# Patient Record
Sex: Female | Born: 1970 | Race: White | Hispanic: No | Marital: Single | Smoking: Former smoker
Health system: Southern US, Community
[De-identification: ages and names within clinical notes are randomized; demographics above are authoritative.]

## PROBLEM LIST (undated history)

## (undated) DIAGNOSIS — K219 Gastro-esophageal reflux disease without esophagitis: Secondary | ICD-10-CM

## (undated) DIAGNOSIS — I10 Essential (primary) hypertension: Secondary | ICD-10-CM

## (undated) DIAGNOSIS — D649 Anemia, unspecified: Secondary | ICD-10-CM

## (undated) DIAGNOSIS — F419 Anxiety disorder, unspecified: Secondary | ICD-10-CM

## (undated) DIAGNOSIS — R002 Palpitations: Secondary | ICD-10-CM

---

## 2019-01-19 HISTORY — PX: COLONOSCOPY: SHX174

## 2021-02-06 ENCOUNTER — Ambulatory Visit: Admit: 2021-02-06 | Payer: Self-pay | Admitting: Optometry

## 2021-02-06 SURGERY — EXCISION MASS
Anesthesia: General | Laterality: Right

## 2021-02-10 ENCOUNTER — Other Ambulatory Visit: Payer: Self-pay | Admitting: Optometry

## 2021-02-10 DIAGNOSIS — R229 Localized swelling, mass and lump, unspecified: Secondary | ICD-10-CM

## 2021-03-02 ENCOUNTER — Ambulatory Visit
Admission: RE | Admit: 2021-03-02 | Discharge: 2021-03-02 | Disposition: A | Discharge status: Home / Self Care | Payer: No Typology Code available for payment source | Source: Ambulatory Visit | Attending: Optometry | Admitting: Optometry

## 2021-03-02 DIAGNOSIS — R229 Localized swelling, mass and lump, unspecified: Secondary | ICD-10-CM

## 2021-03-02 MED ORDER — IOPAMIDOL (ISOVUE-300) INJECTION 61%
75.0000 mL | Freq: Once | INTRAVENOUS | Status: AC | PRN
  Administered 2021-03-02: 75 mL via INTRAVENOUS

## 2021-05-13 ENCOUNTER — Other Ambulatory Visit: Payer: Self-pay

## 2021-05-13 ENCOUNTER — Encounter (HOSPITAL_COMMUNITY): Payer: Self-pay | Admitting: Optometry

## 2021-05-13 NOTE — Progress Notes (Signed)
SDW CALL ? ?Patient was given pre-op instructions over the phone. The opportunity was given for the patient to ask questions. No further questions asked. Patient verbalized understanding of instructions given. ? ? ?PCP - Dr. Di Kindle ?Cardiologist - denies ? ?Chest x-ray - n/a ?EKG - needs DOS ?Stress Test - denies ?ECHO - denies ?Cardiac Cath - denies ? ? ?Blood Thinner Instructions: ?Aspirin Instructions: No NSAIDS ? ?ERAS Protcol - NPO per MD H&P ? ?COVID TEST- n/a ambulatory surgery ? ? ?Anesthesia review: no ? ?Patient denies shortness of breath, fever, cough and chest pain over the phone call ? ?

## 2021-05-15 ENCOUNTER — Other Ambulatory Visit: Payer: Self-pay

## 2021-05-15 ENCOUNTER — Encounter (HOSPITAL_COMMUNITY)
Admission: RE | Disposition: A | Discharge status: Home / Self Care | Payer: Self-pay | Source: Home / Self Care | Attending: Optometry

## 2021-05-15 ENCOUNTER — Ambulatory Visit (HOSPITAL_BASED_OUTPATIENT_CLINIC_OR_DEPARTMENT_OTHER): Payer: No Typology Code available for payment source | Admitting: Anesthesiology

## 2021-05-15 ENCOUNTER — Ambulatory Visit (HOSPITAL_COMMUNITY): Payer: No Typology Code available for payment source | Admitting: Anesthesiology

## 2021-05-15 ENCOUNTER — Encounter (HOSPITAL_COMMUNITY): Payer: Self-pay | Admitting: Optometry

## 2021-05-15 ENCOUNTER — Ambulatory Visit (HOSPITAL_COMMUNITY)
Admission: RE | Admit: 2021-05-15 | Discharge: 2021-05-15 | Disposition: A | Discharge status: Home / Self Care | Payer: No Typology Code available for payment source | Attending: Optometry | Admitting: Optometry

## 2021-05-15 DIAGNOSIS — H04551 Acquired stenosis of right nasolacrimal duct: Secondary | ICD-10-CM

## 2021-05-15 DIAGNOSIS — H04571 Stenosis of right lacrimal sac: Secondary | ICD-10-CM | POA: Diagnosis present

## 2021-05-15 DIAGNOSIS — H0489 Other disorders of lacrimal system: Secondary | ICD-10-CM | POA: Diagnosis not present

## 2021-05-15 DIAGNOSIS — F419 Anxiety disorder, unspecified: Secondary | ICD-10-CM | POA: Diagnosis not present

## 2021-05-15 DIAGNOSIS — K219 Gastro-esophageal reflux disease without esophagitis: Secondary | ICD-10-CM | POA: Diagnosis not present

## 2021-05-15 DIAGNOSIS — Z79899 Other long term (current) drug therapy: Secondary | ICD-10-CM | POA: Diagnosis not present

## 2021-05-15 DIAGNOSIS — H04201 Unspecified epiphora, right lacrimal gland: Secondary | ICD-10-CM

## 2021-05-15 DIAGNOSIS — R229 Localized swelling, mass and lump, unspecified: Secondary | ICD-10-CM

## 2021-05-15 DIAGNOSIS — I10 Essential (primary) hypertension: Secondary | ICD-10-CM | POA: Diagnosis not present

## 2021-05-15 DIAGNOSIS — Z87891 Personal history of nicotine dependence: Secondary | ICD-10-CM | POA: Insufficient documentation

## 2021-05-15 HISTORY — DX: Anxiety disorder, unspecified: F41.9

## 2021-05-15 HISTORY — DX: Anemia, unspecified: D64.9

## 2021-05-15 HISTORY — DX: Essential (primary) hypertension: I10

## 2021-05-15 HISTORY — DX: Gastro-esophageal reflux disease without esophagitis: K21.9

## 2021-05-15 HISTORY — DX: Palpitations: R00.2

## 2021-05-15 HISTORY — PX: DACRORHINOCYSTOTOMY: SHX5559

## 2021-05-15 HISTORY — PX: LACRIMAL DUCT EXPLORATION: SHX6569

## 2021-05-15 SURGERY — EXPLORATION, LACRIMAL DUCT
Anesthesia: General | Site: Eye | Laterality: Right

## 2021-05-15 MED ORDER — BACITRACIN ZINC 500 UNIT/GM EX OINT
TOPICAL_OINTMENT | CUTANEOUS | Status: AC
  Filled 2021-05-15: qty 28.35

## 2021-05-15 MED ORDER — OXYMETAZOLINE HCL 0.05 % NA SOLN
NASAL | Status: AC
  Filled 2021-05-15: qty 30

## 2021-05-15 MED ORDER — OXYCODONE HCL 5 MG PO TABS: 5.0000 mg | ORAL_TABLET | Freq: Once | ORAL | Status: DC | PRN

## 2021-05-15 MED ORDER — ACETAMINOPHEN 650 MG RE SUPP
650.0000 mg | RECTAL | Status: DC | PRN
  Filled 2021-05-15: qty 1

## 2021-05-15 MED ORDER — FENTANYL CITRATE (PF) 100 MCG/2ML IJ SOLN
25.0000 ug | INTRAMUSCULAR | Status: DC | PRN
  Administered 2021-05-15: 50 ug via INTRAVENOUS

## 2021-05-15 MED ORDER — ONDANSETRON HCL 4 MG/2ML IJ SOLN
INTRAMUSCULAR | Status: DC | PRN
  Administered 2021-05-15: 4 mg via INTRAVENOUS

## 2021-05-15 MED ORDER — ORAL CARE MOUTH RINSE: 15.0000 mL | Freq: Once | OROMUCOSAL | Status: AC

## 2021-05-15 MED ORDER — FENTANYL CITRATE (PF) 100 MCG/2ML IJ SOLN
INTRAMUSCULAR | Status: AC
  Filled 2021-05-15: qty 2

## 2021-05-15 MED ORDER — EPHEDRINE 5 MG/ML INJ
INTRAVENOUS | Status: AC
  Filled 2021-05-15: qty 5

## 2021-05-15 MED ORDER — HEMOSTATIC AGENTS (NO CHARGE) OPTIME
TOPICAL | Status: DC | PRN
  Administered 2021-05-15: 1 via TOPICAL

## 2021-05-15 MED ORDER — SODIUM CHLORIDE 0.9% FLUSH: 3.0000 mL | Freq: Two times a day (BID) | INTRAVENOUS | Status: DC

## 2021-05-15 MED ORDER — ERYTHROMYCIN 5 MG/GM OP OINT
1.0000 "application " | TOPICAL_OINTMENT | Freq: Once | OPHTHALMIC | Status: AC
  Administered 2021-05-15: 1 via OPHTHALMIC
  Filled 2021-05-15: qty 3.5

## 2021-05-15 MED ORDER — EPHEDRINE SULFATE-NACL 50-0.9 MG/10ML-% IV SOSY
PREFILLED_SYRINGE | INTRAVENOUS | Status: DC | PRN
  Administered 2021-05-15 (×4): 5 mg via INTRAVENOUS

## 2021-05-15 MED ORDER — FENTANYL CITRATE (PF) 250 MCG/5ML IJ SOLN
INTRAMUSCULAR | Status: AC
  Filled 2021-05-15: qty 5

## 2021-05-15 MED ORDER — OXYCODONE HCL 5 MG PO TABS: 5.0000 mg | ORAL_TABLET | ORAL | Status: DC | PRN

## 2021-05-15 MED ORDER — PHENYLEPHRINE 80 MCG/ML (10ML) SYRINGE FOR IV PUSH (FOR BLOOD PRESSURE SUPPORT)
PREFILLED_SYRINGE | INTRAVENOUS | Status: DC | PRN
  Administered 2021-05-15 (×2): 160 ug via INTRAVENOUS

## 2021-05-15 MED ORDER — SODIUM CHLORIDE 0.9 % IV SOLN: INTRAVENOUS | Status: DC

## 2021-05-15 MED ORDER — STERILE WATER FOR IRRIGATION IR SOLN
Status: DC | PRN
  Administered 2021-05-15: 1000 mL

## 2021-05-15 MED ORDER — OXYMETAZOLINE HCL 0.05 % NA SOLN
NASAL | Status: DC | PRN
  Administered 2021-05-15: 1

## 2021-05-15 MED ORDER — ONDANSETRON HCL 4 MG/2ML IJ SOLN: 4.0000 mg | Freq: Once | INTRAMUSCULAR | Status: DC | PRN

## 2021-05-15 MED ORDER — 0.9 % SODIUM CHLORIDE (POUR BTL) OPTIME
TOPICAL | Status: DC | PRN
  Administered 2021-05-15: 1000 mL

## 2021-05-15 MED ORDER — LIDOCAINE 2% (20 MG/ML) 5 ML SYRINGE
INTRAMUSCULAR | Status: AC
  Filled 2021-05-15: qty 5

## 2021-05-15 MED ORDER — ACETAMINOPHEN 500 MG PO TABS: 1000.0000 mg | ORAL_TABLET | Freq: Four times a day (QID) | ORAL | Status: DC

## 2021-05-15 MED ORDER — MIDAZOLAM HCL 5 MG/5ML IJ SOLN
INTRAMUSCULAR | Status: DC | PRN
Start: 2021-05-15 — End: 2021-05-15
  Administered 2021-05-15: 2 mg via INTRAVENOUS

## 2021-05-15 MED ORDER — ACETAMINOPHEN 325 MG PO TABS
650.0000 mg | ORAL_TABLET | ORAL | Status: DC | PRN
  Filled 2021-05-15: qty 2

## 2021-05-15 MED ORDER — BUPIVACAINE HCL (PF) 0.75 % IJ SOLN
INTRAMUSCULAR | Status: AC
  Filled 2021-05-15: qty 10

## 2021-05-15 MED ORDER — DEXAMETHASONE SODIUM PHOSPHATE 10 MG/ML IJ SOLN
INTRAMUSCULAR | Status: AC
  Filled 2021-05-15: qty 1

## 2021-05-15 MED ORDER — ACETAMINOPHEN 500 MG PO TABS
ORAL_TABLET | ORAL | Status: AC
  Administered 2021-05-15: 1000 mg via ORAL
  Filled 2021-05-15: qty 2

## 2021-05-15 MED ORDER — HYDROCODONE-ACETAMINOPHEN 5-325 MG PO TABS: 1.0000 | ORAL_TABLET | Freq: Four times a day (QID) | ORAL | 0 refills | Status: AC | PRN

## 2021-05-15 MED ORDER — LIDOCAINE-EPINEPHRINE 1 %-1:100000 IJ SOLN
INTRAMUSCULAR | Status: DC | PRN
  Administered 2021-05-15: 2.5 mL

## 2021-05-15 MED ORDER — LIDOCAINE-EPINEPHRINE 1 %-1:100000 IJ SOLN
INTRAMUSCULAR | Status: AC
  Filled 2021-05-15: qty 1

## 2021-05-15 MED ORDER — CHLORHEXIDINE GLUCONATE 0.12 % MT SOLN: 15.0000 mL | Freq: Once | OROMUCOSAL | Status: AC

## 2021-05-15 MED ORDER — FENTANYL CITRATE (PF) 100 MCG/2ML IJ SOLN: 25.0000 ug | INTRAMUSCULAR | Status: DC | PRN

## 2021-05-15 MED ORDER — KETOROLAC TROMETHAMINE 15 MG/ML IJ SOLN: 15.0000 mg | Freq: Four times a day (QID) | INTRAMUSCULAR | Status: DC

## 2021-05-15 MED ORDER — PHENYLEPHRINE 80 MCG/ML (10ML) SYRINGE FOR IV PUSH (FOR BLOOD PRESSURE SUPPORT)
PREFILLED_SYRINGE | INTRAVENOUS | Status: AC
  Filled 2021-05-15: qty 10

## 2021-05-15 MED ORDER — LIDOCAINE 2% (20 MG/ML) 5 ML SYRINGE
INTRAMUSCULAR | Status: DC | PRN
  Administered 2021-05-15: 60 mg via INTRAVENOUS

## 2021-05-15 MED ORDER — ROCURONIUM BROMIDE 10 MG/ML (PF) SYRINGE
PREFILLED_SYRINGE | INTRAVENOUS | Status: AC
  Filled 2021-05-15: qty 10

## 2021-05-15 MED ORDER — DEXAMETHASONE SODIUM PHOSPHATE 10 MG/ML IJ SOLN
INTRAMUSCULAR | Status: DC | PRN
  Administered 2021-05-15: 10 mg via INTRAVENOUS

## 2021-05-15 MED ORDER — LACTATED RINGERS IV SOLN: INTRAVENOUS | Status: DC | PRN

## 2021-05-15 MED ORDER — FENTANYL CITRATE (PF) 250 MCG/5ML IJ SOLN
INTRAMUSCULAR | Status: DC | PRN
  Administered 2021-05-15 (×5): 50 ug via INTRAVENOUS

## 2021-05-15 MED ORDER — OXYMETAZOLINE HCL 0.05 % NA SOLN
1.0000 | NASAL | Status: AC
  Administered 2021-05-15 (×2): 1 via NASAL

## 2021-05-15 MED ORDER — MIDAZOLAM HCL 2 MG/2ML IJ SOLN
INTRAMUSCULAR | Status: AC
  Filled 2021-05-15: qty 2

## 2021-05-15 MED ORDER — BSS IO SOLN
INTRAOCULAR | Status: DC | PRN
  Administered 2021-05-15: 15 mL

## 2021-05-15 MED ORDER — ONDANSETRON HCL 4 MG/2ML IJ SOLN
INTRAMUSCULAR | Status: AC
  Filled 2021-05-15: qty 2

## 2021-05-15 MED ORDER — ACETAMINOPHEN 500 MG PO TABS
1000.0000 mg | ORAL_TABLET | Freq: Once | ORAL | Status: AC
Start: 2021-05-15 — End: 2021-05-15

## 2021-05-15 MED ORDER — ERYTHROMYCIN 5 MG/GM OP OINT
TOPICAL_OINTMENT | OPHTHALMIC | Status: AC
  Filled 2021-05-15: qty 3.5

## 2021-05-15 MED ORDER — OXYCODONE HCL 5 MG/5ML PO SOLN: 5.0000 mg | Freq: Once | ORAL | Status: DC | PRN

## 2021-05-15 MED ORDER — PROPOFOL 10 MG/ML IV BOLUS
INTRAVENOUS | Status: AC
  Filled 2021-05-15: qty 20

## 2021-05-15 MED ORDER — NEOMYCIN-POLYMYXIN-DEXAMETH 3.5-10000-0.1 OP SUSP
1.0000 [drp] | Freq: Once | OPHTHALMIC | Status: AC
Start: 2021-05-15 — End: 2021-05-15
  Administered 2021-05-15: 1 [drp] via OPHTHALMIC
  Filled 2021-05-15 (×2): qty 5

## 2021-05-15 MED ORDER — SODIUM CHLORIDE 0.9% FLUSH: 3.0000 mL | INTRAVENOUS | Status: DC | PRN

## 2021-05-15 MED ORDER — AMISULPRIDE (ANTIEMETIC) 5 MG/2ML IV SOLN: 10.0000 mg | Freq: Once | INTRAVENOUS | Status: DC | PRN

## 2021-05-15 MED ORDER — SODIUM CHLORIDE 0.9 % IV SOLN: 250.0000 mL | INTRAVENOUS | Status: DC | PRN

## 2021-05-15 MED ORDER — PROPOFOL 10 MG/ML IV BOLUS
INTRAVENOUS | Status: DC | PRN
  Administered 2021-05-15: 50 mg via INTRAVENOUS
  Administered 2021-05-15: 200 mg via INTRAVENOUS
  Administered 2021-05-15 (×2): 50 mg via INTRAVENOUS

## 2021-05-15 MED ORDER — OXYMETAZOLINE HCL 0.05 % NA SOLN
NASAL | Status: AC
  Administered 2021-05-15: 1 via NASAL
  Filled 2021-05-15: qty 30

## 2021-05-15 MED ORDER — CHLORHEXIDINE GLUCONATE 0.12 % MT SOLN
OROMUCOSAL | Status: AC
  Administered 2021-05-15: 15 mL via OROMUCOSAL
  Filled 2021-05-15: qty 15

## 2021-05-15 MED ORDER — TRIAMCINOLONE ACETONIDE 40 MG/ML IJ SUSP
INTRAMUSCULAR | Status: DC | PRN
  Administered 2021-05-15: 40 mg

## 2021-05-15 MED ORDER — TRIAMCINOLONE ACETONIDE 40 MG/ML IJ SUSP
INTRAMUSCULAR | Status: AC
  Filled 2021-05-15: qty 5

## 2021-05-15 MED ORDER — BSS IO SOLN
INTRAOCULAR | Status: AC
  Filled 2021-05-15: qty 15

## 2021-05-15 SURGICAL SUPPLY — 38 items
APPLICATOR COTTON TIP 6 STRL (MISCELLANEOUS) ×1 IMPLANT
APPLICATOR COTTON TIP 6IN STRL (MISCELLANEOUS) ×2 IMPLANT
BAG COUNTER SPONGE SURGICOUNT (BAG) ×2 IMPLANT
BLADE SURG 15 STRL LF DISP TIS (BLADE) ×1 IMPLANT
BLADE SURG 15 STRL SS (BLADE) ×1
CORD BIPOLAR FORCEPS 12FT (ELECTRODE) ×2 IMPLANT
COVER SURGICAL LIGHT HANDLE (MISCELLANEOUS) ×2 IMPLANT
DRAPE ORTHO SPLIT 77X108 STRL (DRAPES) ×1
DRAPE SURG ORHT 6 SPLT 77X108 (DRAPES) ×1 IMPLANT
FORCEPS BIPOLAR SPETZLER 8 1.0 (NEUROSURGERY SUPPLIES) ×2 IMPLANT
GAUZE 4X4 16PLY ~~LOC~~+RFID DBL (SPONGE) ×2 IMPLANT
GLOVE BIO SURGEON STRL SZ7.5 (GLOVE) ×3 IMPLANT
GLOVE SURG SYN 7.5  E (GLOVE) ×1
GLOVE SURG SYN 7.5 E (GLOVE) ×1 IMPLANT
GLOVE SURG SYN 7.5 PF PI (GLOVE) ×1 IMPLANT
GOWN STRL REUS W/ TWL LRG LVL3 (GOWN DISPOSABLE) ×2 IMPLANT
GOWN STRL REUS W/TWL LRG LVL3 (GOWN DISPOSABLE) ×2
KIT BASIN OR (CUSTOM PROCEDURE TRAY) ×2 IMPLANT
KNIFE CRESCENT 1.75 EDGEAHEAD (BLADE) ×2 IMPLANT
NDL PRECISIONGLIDE 27X1.5 (NEEDLE) ×2 IMPLANT
NEEDLE PRECISIONGLIDE 27X1.5 (NEEDLE) ×4 IMPLANT
NS IRRIG 1000ML POUR BTL (IV SOLUTION) ×2 IMPLANT
PACK VITRECTOMY CUSTOM (CUSTOM PROCEDURE TRAY) ×1 IMPLANT
PAD ARMBOARD 7.5X6 YLW CONV (MISCELLANEOUS) ×4 IMPLANT
PATTIES SURGICAL .5 X3 (DISPOSABLE) ×2 IMPLANT
SET INTBT LACRIMAL .016X.025 (DRAIN) ×2 IMPLANT
SPONGE SURGIFOAM ABS GEL 12-7 (HEMOSTASIS) ×2 IMPLANT
SUT BONE WAX W31G (SUTURE) ×1 IMPLANT
SUT CHROMIC 4 0 S 4 (SUTURE) ×1 IMPLANT
SUT PLAIN 5 0 P 3 18 (SUTURE) ×1 IMPLANT
SUT PROLENE 6 0 CC (SUTURE) IMPLANT
SUT PROLENE 6 0 P 3 18 (SUTURE) ×2 IMPLANT
SUT VICRYL 6 0 S 14 UNDY (SUTURE) ×1 IMPLANT
SUT VICRYL 6 0 S 29 12 (SUTURE) ×1 IMPLANT
TOWEL GREEN STERILE FF (TOWEL DISPOSABLE) ×4 IMPLANT
TREPHINE OPHTH SISLER 21GA (BLADE) ×1 IMPLANT
TUBE CONNECTING 12X1/4 (SUCTIONS) ×2 IMPLANT
WATER STERILE IRR 1000ML POUR (IV SOLUTION) ×2 IMPLANT

## 2021-05-15 NOTE — Anesthesia Preprocedure Evaluation (Addendum)
Anesthesia Evaluation  ?Patient identified by MRN, date of birth, ID band ?Patient awake ? ? ? ?Reviewed: ?Allergy & Precautions, NPO status , Patient's Chart, lab work & pertinent test results ? ?Airway ?Mallampati: II ? ?TM Distance: >3 FB ?Neck ROM: Full ? ? ? Dental ?no notable dental hx. ?(+) Dental Advisory Given, Teeth Intact ?  ?Pulmonary ?neg pulmonary ROS, former smoker,  ?  ?Pulmonary exam normal ?breath sounds clear to auscultation ? ? ? ? ? ? Cardiovascular ?hypertension, Pt. on medications ?Normal cardiovascular exam ?Rhythm:Regular Rate:Normal ? ? ?  ?Neuro/Psych ?PSYCHIATRIC DISORDERS Anxiety negative neurological ROS ? negative psych ROS  ? GI/Hepatic ?Neg liver ROS, GERD  Medicated and Controlled,  ?Endo/Other  ?negative endocrine ROS ? Renal/GU ?negative Renal ROS  ?negative genitourinary ?  ?Musculoskeletal ?negative musculoskeletal ROS ?(+)  ? Abdominal ?  ?Peds ?negative pediatric ROS ?(+)  Hematology ?negative hematology ROS ?(+)   ?Anesthesia Other Findings ? ? Reproductive/Obstetrics ?negative OB ROS ? ?  ? ? ? ? ? ? ? ? ? ? ? ? ? ?  ?  ? ? ? ? ? ? ? ?Anesthesia Physical ?Anesthesia Plan ? ?ASA: 2 ? ?Anesthesia Plan: General  ? ?Post-op Pain Management: Tylenol PO (pre-op)*  ? ?Induction: Intravenous ? ?PONV Risk Score and Plan: 3 and Treatment may vary due to age or medical condition, Midazolam, Scopolamine patch - Pre-op, Ondansetron and Dexamethasone ? ?Airway Management Planned: LMA ? ?Additional Equipment: None ? ?Intra-op Plan:  ? ?Post-operative Plan: Extubation in OR ? ?Informed Consent: I have reviewed the patients History and Physical, chart, labs and discussed the procedure including the risks, benefits and alternatives for the proposed anesthesia with the patient or authorized representative who has indicated his/her understanding and acceptance.  ? ? ? ?Dental advisory given ? ?Plan Discussed with: CRNA, Anesthesiologist and  Surgeon ? ?Anesthesia Plan Comments:   ? ? ? ? ? ? ?Anesthesia Quick Evaluation ? ?

## 2021-05-15 NOTE — Transfer of Care (Signed)
Immediate Anesthesia Transfer of Care Note ? ?Patient: Janice Morales ? ?Procedure(s) Performed: Right Biopsy of lacrimal duct sac and Nasolacrimal duct probe with stent placement (Right: Eye) ?DACROCYSTORHINOSTOMY (DCR) (Right: Eye) ? ?Patient Location: PACU ? ?Anesthesia Type:General ? ?Level of Consciousness: awake, oriented and patient cooperative ? ?Airway & Oxygen Therapy: Patient Spontanous Breathing and Patient connected to nasal cannula oxygen ? ?Post-op Assessment: Report given to RN and Post -op Vital signs reviewed and stable ? ?Post vital signs: Reviewed ? ?Last Vitals:  ?Vitals Value Taken Time  ?BP 146/89 05/15/21 0956  ?Temp    ?Pulse 102 05/15/21 0958  ?Resp 15 05/15/21 0958  ?SpO2 92 % 05/15/21 0958  ?Vitals shown include unvalidated device data. ? ?Last Pain:  ?Vitals:  ? 05/15/21 0631  ?TempSrc:   ?PainSc: 0-No pain  ?   ? ?  ? ?Complications: No notable events documented. ?

## 2021-05-15 NOTE — Anesthesia Procedure Notes (Signed)
Procedure Name: LMA Insertion ?Date/Time: 05/15/2021 7:37 AM ?Performed by: Jenne Campus, CRNA ?Pre-anesthesia Checklist: Patient identified, Emergency Drugs available, Suction available and Patient being monitored ?Patient Re-evaluated:Patient Re-evaluated prior to induction ?Oxygen Delivery Method: Circle System Utilized ?Preoxygenation: Pre-oxygenation with 100% oxygen ?Induction Type: IV induction ?Ventilation: Mask ventilation without difficulty ?LMA: LMA inserted ?LMA Size: 4.0 ?Number of attempts: 1 ?Airway Equipment and Method: Bite block ?Placement Confirmation: positive ETCO2 and breath sounds checked- equal and bilateral ?Tube secured with: Tape ?Dental Injury: Teeth and Oropharynx as per pre-operative assessment  ? ? ? ? ?

## 2021-05-15 NOTE — H&P (Signed)
I have reviewed the H&P from Crista Elliot  on 04/27/21. ? ?Patient remains appropriate for planned surgical procedure today. ? ?Dairl Ponder, MD, PhD ?Ophthalmic Plastic and Reconstructive Surgery ?[employer] ?[phone] (office) ? ?

## 2021-05-15 NOTE — Op Note (Signed)
Operative Note ?PATIENT NAME:  Janice Morales ?MRN:  [id number] ?DATE OF SERVICE:  05/15/2021  ?DATE OF BIRTH:  Jan 18, 1971 ? ?PREOPERATIVE DIAGNOSIS:   ?1. Nasolacrimal duct obstruction, right ?2. Epiphora, right ?3. Subcutaneous mass, right (neoplasm of undetermined significance) ? ?POSTOPERATIVE DIAGNOSIS:  same ? ?PROCEDURE(S) PERFORMED:  ?1. Dacryocystorhinostomy, right CPT [id number] ?2. Nasolacrimal duct probe with placement of Crawford stent, right CPT [phone] ?3. Excisional biopsy of subcutaneous lesion, right  ? ?SURGEON:  Dairl Ponder, MD, PhD ?ASSISTANT:  none ? ?ANESTHESIA:  General ? ?FLUIDS GIVEN: Per Anesthesia  ? ?ESTIMATED BLOOD LOSS:  30 cc ? ?TUBES/DRAINS:  Crawford stent, right nasolacrimal system ? ?SPECIMENS/CULTURES:  ?Right subcutaneous mass ?Right nasolacrimal sac  ? ?COMPLICATIONS:  None ? ?DESCRIPTION OF PROCEDURE: After obtaining informed consent, the patient was taken back to the operating room and laid supine on the operating room table.  Under monitored anesthesia care, the operative site(s) were anesthetized with 2% lidocaine with epinephrine and bupivicaine.  The nares were packed with Afrin-soaked pledgets.  The patient was then prepped and draped in the usual sterile fashion.  ? ?An incision was made in the lower lid and medial canthal area.  Hemostasis was obtained with bipolar cautery.  A combination of blunt and sharp dissection were made through orbicularis, freeing up a reddish-appearing cystic mass. This was excised in its entirety and submitted to pathology. Dissection was then carried down to the anterior nasolacrimal crest. A Freer elevator was used to lift the periosteum and nasal mucosa from the bone and infracture the lacrimal bone.  Rongeurs were then used to remove bone in the area of the nasolacrimal sac.  The nasal mucosa was incised.  Once an adequate osteotomy had been made, the lower punctum of the eyelid was dilated and a 00 Bowman probe passed.  This probe was used to  tent up the mucosa of the underlying nasolacrimal sac.  A crescent blade was then used to make an incision in the nasolacrimal sac. A biopsy of the nasolacrimal sac was obtained and sent to pathology.  The upper and lower canaliculi were then dilated and a bicanalicular Crawford stent was placed retrieved in the nose.  A cotton tip was placed underneath the loop of the stent to protect the canaliculi.  Tension was placed on the Crawford stents and then [facility] Gelfoam was passed to the ostomy.  A 6-0 Prolene was then used to tie the stents in the nose.  A additional knot was placed in the stents themselves distal to this 6-0 Prolene suture.  The stent was allowed to retract back into the nose, the cotton tip applicator was removed from the medial canthus and the stents pulled to adequate tension.  Kenalog-soaked gelfoam was passed over the stents and into the ostium. The stents were then trimmed inside the nasal antrum.  The anterior flaps were then closed using 6-0 Vicryl suture.  The orbicularis was closed using 6-0 Vicryl suture.  The skin was closed using 6-0 Prolene suture.  A drop of Maxitrol was placed in the operative eye. Ointment was placed in the operative eye(s) and incision.  ? ?The patient was then taken to the recovery room in stable condition.  ? ?Dairl Ponder, MD, PhD ?Ophthalmic Plastic and Reconstructive Surgery ?[employer] ?[phone] (office)  ?

## 2021-05-17 NOTE — Anesthesia Postprocedure Evaluation (Signed)
Anesthesia Post Note ? ?Patient: Alexica Schlossberg ? ?Procedure(s) Performed: Right Biopsy of lacrimal duct sac and Nasolacrimal duct probe with stent placement (Right: Eye) ?DACROCYSTORHINOSTOMY (DCR) (Right: Eye) ? ?  ? ?Patient location during evaluation: PACU ?Anesthesia Type: General ?Level of consciousness: awake ?Pain management: pain level controlled ?Vital Signs Assessment: post-procedure vital signs reviewed and stable ?Respiratory status: spontaneous breathing and respiratory function stable ?Cardiovascular status: stable ?Postop Assessment: no apparent nausea or vomiting ?Anesthetic complications: no ? ? ?No notable events documented. ? ?Last Vitals:  ?Vitals:  ? 05/15/21 1025 05/15/21 1031  ?BP: (!) 154/80 (!) 149/76  ?Pulse: 91 90  ?Resp: 15 17  ?Temp: 37.1 ?C   ?SpO2: 91% 93%  ?  ?Last Pain:  ?Vitals:  ? 05/15/21 1025  ?TempSrc:   ?PainSc: 0-No pain  ? ? ?  ?  ?  ?  ?  ?  ? ?Mellody Dance ? ? ? ? ?

## 2021-05-18 ENCOUNTER — Encounter (HOSPITAL_COMMUNITY): Payer: Self-pay | Admitting: Optometry

## 2021-05-18 LAB — SURGICAL PATHOLOGY
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
